# Patient Record
Sex: Female | Born: 2000 | Race: White | Hispanic: No | Marital: Single | Smoking: Never smoker
Health system: Southern US, Community
[De-identification: ages and names within clinical notes are randomized; demographics above are authoritative.]

## PROBLEM LIST (undated history)

## (undated) DIAGNOSIS — F419 Anxiety disorder, unspecified: Secondary | ICD-10-CM

## (undated) DIAGNOSIS — C801 Malignant (primary) neoplasm, unspecified: Secondary | ICD-10-CM

## (undated) HISTORY — PX: ADENOIDECTOMY: SHX5191

## (undated) HISTORY — PX: TONSILLECTOMY: SUR1361

---
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
## 2022-01-25 ENCOUNTER — Ambulatory Visit: Payer: Self-pay | Admitting: Psychiatry

## 2022-09-01 ENCOUNTER — Ambulatory Visit
Admission: EM | Admit: 2022-09-01 | Discharge: 2022-09-01 | Disposition: A | Discharge status: Home / Self Care | Payer: Medicaid Other | Attending: Physician Assistant | Admitting: Physician Assistant

## 2022-09-01 DIAGNOSIS — R109 Unspecified abdominal pain: Secondary | ICD-10-CM | POA: Insufficient documentation

## 2022-09-01 DIAGNOSIS — R300 Dysuria: Secondary | ICD-10-CM | POA: Insufficient documentation

## 2022-09-01 DIAGNOSIS — R000 Tachycardia, unspecified: Secondary | ICD-10-CM | POA: Insufficient documentation

## 2022-09-01 DIAGNOSIS — R509 Fever, unspecified: Secondary | ICD-10-CM | POA: Insufficient documentation

## 2022-09-01 DIAGNOSIS — N10 Acute tubulo-interstitial nephritis: Secondary | ICD-10-CM | POA: Diagnosis present

## 2022-09-01 LAB — WET PREP, GENITAL
Clue Cells Wet Prep HPF POC: NONE SEEN
Sperm: NONE SEEN
Trich, Wet Prep: NONE SEEN
WBC, Wet Prep HPF POC: <10 (ref <10)
Yeast Wet Prep HPF POC: NONE SEEN

## 2022-09-01 LAB — URINALYSIS, W/ REFLEX TO CULTURE (INFECTION SUSPECTED)
Bilirubin Urine: NEGATIVE
Glucose, UA: NEGATIVE mg/dL
Nitrite: NEGATIVE
Protein, ur: 30 mg/dL — AB
Specific Gravity, Urine: 1.015 (ref 1.005–1.030)
WBC, UA: >50 WBC/hpf (ref 0–5)
pH: 6 (ref 5.0–8.0)

## 2022-09-01 MED ORDER — CEFTRIAXONE SODIUM 1 G IJ SOLR
1.0000 g | Freq: Once | INTRAMUSCULAR | Status: AC
  Administered 2022-09-01: 1 g via INTRAMUSCULAR

## 2022-09-01 MED ORDER — KETOROLAC TROMETHAMINE 10 MG PO TABS: 10.0000 mg | ORAL_TABLET | Freq: Four times a day (QID) | ORAL | 0 refills | Status: AC | PRN

## 2022-09-01 MED ORDER — ONDANSETRON 8 MG PO TBDP: 8.0000 mg | ORAL_TABLET | Freq: Once | ORAL | Status: DC

## 2022-09-01 MED ORDER — KETOROLAC TROMETHAMINE 60 MG/2ML IM SOLN
30.0000 mg | Freq: Once | INTRAMUSCULAR | Status: AC
  Administered 2022-09-01: 30 mg via INTRAMUSCULAR

## 2022-09-01 MED ORDER — CIPROFLOXACIN HCL 500 MG PO TABS: 500.0000 mg | ORAL_TABLET | Freq: Two times a day (BID) | ORAL | 0 refills | Status: AC

## 2022-09-01 MED ORDER — ONDANSETRON 4 MG PO TBDP
4.0000 mg | ORAL_TABLET | Freq: Once | ORAL | Status: AC
  Administered 2022-09-01: 4 mg via ORAL

## 2022-09-01 MED ORDER — ONDANSETRON 4 MG PO TBDP: 4.0000 mg | ORAL_TABLET | Freq: Three times a day (TID) | ORAL | 0 refills | Status: AC | PRN

## 2022-09-01 NOTE — Discharge Instructions (Addendum)
UTI: Based on either symptoms or urinalysis, you may have a urinary tract infection. We will send the urine for culture and call with results in a few days. Begin antibiotics at this time. Your symptoms should be much improved over the next 2-3 days. Increase rest and fluid intake. If for some reason symptoms are worsening or not improving after a couple of days or the urine culture determines the antibiotics you are taking will not treat the infection, the antibiotics may be changed. Return or go to ER for fever, back pain, worsening urinary pain, discharge, increased blood in urine. May take Tylenol or Motrin OTC for pain relief or consider AZO if no contraindications   If no improvement in 2 days, worsening pain or fever not resolving go to ER.

## 2022-09-01 NOTE — ED Provider Notes (Signed)
[facility]    CSN: [id number] Arrival date & time: 09/01/22  0912      History   Chief Complaint Chief Complaint  Patient presents with   Urinary Tract Infection    HPI Desiree Gentry is a 22 y.o. female presenting for 6-day history of dysuria, frequency and urgency.  Patient says over the past 2 days she developed a fever up to 100.8 degrees, nausea, right-sided flank pain with radiation to the abdomen.  Reports taking AZO until yesterday and thought symptoms were improving.  Denies dizziness, weakness, blood in urine.  Reports some vaginal discharge but no report of any concern for STIs.  Last menstrual period 08/11/2022.  Concern for potential UTI.  Patient reports that she has a lot of anxiety and her heart rate accelerates when she is at the doctor's office so that is why she did not want to come in sooner.  No other complaints.  HPI  Past Medical History:  Diagnosis Date   Anxiety    Cancer [facility])     Patient Active Problem List   Diagnosis Date Noted   Seizure ([place]) 11/25/2021   Obesity (BMI 30-39.9) 11/25/2021   Panic disorder 11/25/2021   GERD (gastroesophageal reflux disease) 11/04/2021   GAD (generalized anxiety disorder) 11/04/2021    Past Surgical History:  Procedure Laterality Date   ADENOIDECTOMY     TONSILLECTOMY      OB History   No obstetric history on file.      Home Medications    Prior to Admission medications   Medication Sig Start Date End Date Taking? Authorizing Provider  ciprofloxacin (CIPRO) 500 MG tablet Take 1 tablet (500 mg total) by mouth 2 (two) times daily for 7 days. 09/01/22 09/08/22 Yes Laurene Footman B, PA-C  escitalopram (LEXAPRO) 10 MG tablet Take 10 mg by mouth daily. 11/04/21  Yes [provider]  esomeprazole (NEXIUM) 40 MG capsule Take 40 mg by mouth daily.   Yes [provider]  ketorolac (TORADOL) 10 MG tablet Take 1 tablet (10 mg total) by mouth every 6 (six) hours as needed. 09/01/22  Yes  Danton Clap, PA-C  levETIRAcetam (KEPPRA) 500 MG tablet Take 1 tablet (500 mg total) by mouth 2 (two) times daily. 11/26/21  Yes Wouk, Ailene Rud, MD  norethindrone-ethinyl estradiol (LOESTRIN) 1-20 MG-MCG tablet Take 1 tablet by mouth daily.   Yes [provider]  ondansetron (ZOFRAN-ODT) 4 MG disintegrating tablet Take 1 tablet (4 mg total) by mouth every 8 (eight) hours as needed for up to 5 days for nausea or vomiting. 09/01/22 09/06/22 Yes Danton Clap, PA-C  pantoprazole (PROTONIX) 40 MG tablet Take 40 mg by mouth daily. 11/21/21   [provider]    Family History Family History  Problem Relation Age of Onset   Healthy Mother    Diabetes Father     Social History Social History   Tobacco Use   Smoking status: Never   Smokeless tobacco: Never  Vaping Use   Vaping Use: Never used  Substance Use Topics   Alcohol use: Never   Drug use: Never     Allergies   Patient has no known allergies.   Review of Systems Review of Systems  Constitutional:  Positive for fatigue and fever. Negative for chills.  Gastrointestinal:  Positive for abdominal pain and nausea. Negative for diarrhea and vomiting.  Genitourinary:  Positive for dysuria, flank pain, frequency and urgency. Negative for decreased urine volume, hematuria, pelvic pain, vaginal  bleeding, vaginal discharge and vaginal pain.  Musculoskeletal:  Negative for back pain.  Skin:  Negative for rash.     Physical Exam Triage Vital Signs ED Triage Vitals  Enc Vitals Group     BP      Pulse      Resp      Temp      Temp src      SpO2      Weight      Height      Head Circumference      Peak Flow      Pain Score      Pain Loc      Pain Edu?      Excl. in [place]?    No data found.  Updated Vital Signs BP 123/77 (BP Location: Left Arm)   Pulse (!) 144   Temp 98.9 F (37.2 C) (Oral)   Ht 5\' 3"  (1.6 m)   Wt 204 lb (92.5 kg)   LMP 08/11/2022   SpO2 100%   BMI 36.14 kg/m    Physical  Exam Vitals and nursing note reviewed.  Constitutional:      General: She is not in acute distress.    Appearance: Normal appearance. She is not ill-appearing or toxic-appearing.  HENT:     Head: Normocephalic and atraumatic.  Eyes:     General: No scleral icterus.       Right eye: No discharge.        Left eye: No discharge.     Conjunctiva/sclera: Conjunctivae normal.  Cardiovascular:     Rate and Rhythm: Regular rhythm. Tachycardia present.     Heart sounds: Normal heart sounds.  Pulmonary:     Effort: Pulmonary effort is normal. No respiratory distress.     Breath sounds: Normal breath sounds.  Abdominal:     Palpations: Abdomen is soft.     Tenderness: There is no abdominal tenderness. There is right CVA tenderness. There is no left CVA tenderness.  Musculoskeletal:     Cervical back: Neck supple.  Skin:    General: Skin is dry.  Neurological:     General: No focal deficit present.     Mental Status: She is alert. Mental status is at baseline.     Motor: No weakness.     Gait: Gait normal.  Psychiatric:        Mood and Affect: Mood normal.        Behavior: Behavior normal.        Thought Content: Thought content normal.      UC Treatments / Results  Labs (all labs ordered are listed, but only abnormal results are displayed) Labs Reviewed  URINALYSIS, W/ REFLEX TO CULTURE (INFECTION SUSPECTED) - Abnormal; Notable for the following components:      Result Value   APPearance HAZY (*)    Hgb urine dipstick MODERATE (*)    Ketones, ur TRACE (*)    Protein, ur 30 (*)    Leukocytes,Ua LARGE (*)    Bacteria, UA MANY (*)    All other components within normal limits  WET PREP, GENITAL  URINE CULTURE    EKG   Radiology No results found.  Procedures Procedures (including critical care time)  Medications Ordered in UC Medications  cefTRIAXone (ROCEPHIN) injection 1 g (1 g Intramuscular Given 09/01/22 1049)  ketorolac (TORADOL) injection 30 mg (30 mg  Intramuscular Given 09/01/22 1050)  ondansetron (ZOFRAN-ODT) disintegrating tablet 4 mg (4 mg Oral Given 09/01/22 1049)  Initial Impression / Assessment and Plan / UC Course  I have reviewed the triage vital signs and the nursing notes.  Pertinent labs & imaging results that were available during my care of the patient were reviewed by me and considered in my medical decision making (see chart for details).   22 year old female presents for 6-day history of dysuria, frequency and urgency.  Reports a fever, nausea and right-sided flank pain over the past couple days.  Pulse elevated at 144 bpm.  Patient reports being anxious.  She is afebrile and has not taken anything for fever.  On exam she does have right CVA tenderness but no abdominal tenderness.  Chest clear to auscultation and heart regular rhythm.  Wet prep negative. Urinalysis shows hazy urine with moderate hemoglobin, trace ketones and protein, large leukocytes and many bacteria.  Will send urine for culture.  Presentation consistent with acute pyelonephritis of the right side.  Patient given 30 mg IM ketorolac in clinic for acute pain relief, 4 mg ODT Zofran, and 1 g Rocephin.  Sent Cipro to pharmacy as well as Zofran and ketorolac.  Encouraged increasing rest and fluid intake.  Advised patient not to wait more than 2 days after having urinary symptoms before coming to a clinic to be evaluated for UTI.  Explained that her symptoms should be getting significantly better over the next 2 days.  Advised if they worsen or do not improve she should go to the emergency department.   Final Clinical Impressions(s) / UC Diagnoses   Final diagnoses:  Acute pyelonephritis  Dysuria  Right flank pain  Fever, unspecified  Tachycardia     Discharge Instructions      UTI: Based on either symptoms or urinalysis, you may have a urinary tract infection. We will send the urine for culture and call with results in a few days. Begin antibiotics  at this time. Your symptoms should be much improved over the next 2-3 days. Increase rest and fluid intake. If for some reason symptoms are worsening or not improving after a couple of days or the urine culture determines the antibiotics you are taking will not treat the infection, the antibiotics may be changed. Return or go to ER for fever, back pain, worsening urinary pain, discharge, increased blood in urine. May take Tylenol or Motrin OTC for pain relief or consider AZO if no contraindications   If no improvement in 2 days, worsening pain or fever not resolving go to ER.     ED Prescriptions     Medication Sig Dispense Auth. Provider   ciprofloxacin (CIPRO) 500 MG tablet Take 1 tablet (500 mg total) by mouth 2 (two) times daily for 7 days. 14 tablet Laurene Footman B, PA-C   ondansetron (ZOFRAN-ODT) 4 MG disintegrating tablet Take 1 tablet (4 mg total) by mouth every 8 (eight) hours as needed for up to 5 days for nausea or vomiting. 15 tablet Laurene Footman B, PA-C   ketorolac (TORADOL) 10 MG tablet Take 1 tablet (10 mg total) by mouth every 6 (six) hours as needed. 20 tablet Gretta Cool      PDMP not reviewed this encounter.   Danton Clap, PA-C 09/01/22 1056

## 2022-09-01 NOTE — ED Triage Notes (Addendum)
Pt is with her mom and boyfriend  Pt c/o lower right back, flank, and abdomen pain, temperature of 100.8, nausea, body chills x2days  Pt states that she is having vaginal discharge.   Pt has tried taking cranberry juice and azo for pain.

## 2022-09-03 LAB — URINE CULTURE: Culture: 80000 — AB
